# Patient Record
Sex: Male | Born: 1965 | Race: White | Hispanic: No | Marital: Single | Smoking: Never smoker
Health system: Southern US, Community
[De-identification: ages and names within clinical notes are randomized; demographics above are authoritative.]

## PROBLEM LIST (undated history)

## (undated) DIAGNOSIS — L509 Urticaria, unspecified: Secondary | ICD-10-CM

## (undated) DIAGNOSIS — B958 Unspecified staphylococcus as the cause of diseases classified elsewhere: Secondary | ICD-10-CM

## (undated) HISTORY — PX: KNEE SURGERY: SHX244

---

## 2004-07-20 ENCOUNTER — Emergency Department (HOSPITAL_COMMUNITY): Admission: EM | Admit: 2004-07-20 | Discharge: 2004-07-20 | Payer: Self-pay | Admitting: Emergency Medicine

## 2004-09-26 ENCOUNTER — Emergency Department (HOSPITAL_COMMUNITY): Admission: EM | Admit: 2004-09-26 | Discharge: 2004-09-26 | Payer: Self-pay | Admitting: Emergency Medicine

## 2005-11-24 ENCOUNTER — Emergency Department (HOSPITAL_COMMUNITY): Admission: EM | Admit: 2005-11-24 | Discharge: 2005-11-24 | Payer: Self-pay | Admitting: Emergency Medicine

## 2015-03-16 ENCOUNTER — Ambulatory Visit
Admission: EM | Admit: 2015-03-16 | Discharge: 2015-03-16 | Disposition: A | Discharge status: Home / Self Care | Payer: 59 | Attending: Family Medicine | Admitting: Family Medicine

## 2015-03-16 ENCOUNTER — Ambulatory Visit: Payer: BLUE CROSS/BLUE SHIELD

## 2015-03-16 DIAGNOSIS — S62634B Displaced fracture of distal phalanx of right ring finger, initial encounter for open fracture: Secondary | ICD-10-CM | POA: Insufficient documentation

## 2015-03-16 DIAGNOSIS — S62639B Displaced fracture of distal phalanx of unspecified finger, initial encounter for open fracture: Secondary | ICD-10-CM

## 2015-03-16 DIAGNOSIS — X58XXXA Exposure to other specified factors, initial encounter: Secondary | ICD-10-CM | POA: Insufficient documentation

## 2015-03-16 MED ORDER — SULFAMETHOXAZOLE-TRIMETHOPRIM 800-160 MG PO TABS: 1.0000 | ORAL_TABLET | Freq: Two times a day (BID) | ORAL | Status: DC

## 2015-03-16 NOTE — ED Notes (Signed)
Moving a piece of equipment (metal) on Friday afternoon and right 4th finger. Has laceration approx. 1 cm to finger tip and remains oozing. Was at work and instructed by boss to have finger checked out.

## 2015-03-16 NOTE — ED Provider Notes (Signed)
CSN: [id number]     Arrival date & time 03/16/15  1116 History   None    Chief Complaint  Patient presents with  . Laceration   (Consider location/radiation/quality/duration/timing/severity/associated sxs/prior Treatment) HPI   This 49 year old male who presents with a crush injury to his right dominant ring finger tuft. 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 He snatched his hand from the entrapment and had sustained a burst laceration to the very tip of his finger. He did not seek medical attention until today at the insistence of his neighbors. He has good sensation and good function of the tip.   History reviewed. No pertinent past medical history. Past Surgical History  Procedure Laterality Date  . Knee surgery Right    History reviewed. No pertinent family history. History  Substance Use Topics  . Smoking status: Never Smoker   . Smokeless tobacco: Not on file  . Alcohol Use: Yes     Comment: socially    Review of Systems  All other systems reviewed and are negative.   Allergies  Review of patient's allergies indicates no known allergies.  Home Medications   Prior to Admission medications   Medication Sig Start Date End Date Taking? Authorizing Provider  loratadine (CLARITIN) 10 MG tablet Take 10 mg by mouth daily.   Yes Historical Provider, MD  sulfamethoxazole-trimethoprim (BACTRIM DS,SEPTRA DS) 800-160 MG per tablet Take 1 tablet by mouth 2 (two) times daily. 03/16/15   Chrissie Noa Reuben Knoblock, PA-C   BP 152/101 mmHg  Pulse 70  Temp(Src) 97.7 F (36.5 C) (Tympanic)  Resp 18  Ht 6' (1.829 m)  Wt 222 lb (100.699 kg)  BMI 30.10 kg/m2  SpO2 98% Physical Exam  Constitutional: He is oriented to person, place, and time. He appears well-developed and well-nourished.  HENT:  Head: Normocephalic and atraumatic.  Eyes: Pupils are equal, round, and reactive to light.  Neck: Normal range  of motion. Neck supple.  Musculoskeletal: Normal range of motion.  Examination of the right hand shows a swollen distal fingertip of the right ring finger with a small 0.5 cm at the very tip. There is ecchymosis present. There is normal sensation to light touch. Flexion extension of the digit is normal. There is no subungual hematoma present.  Neurological: He is alert and oriented to person, place, and time. He has normal reflexes.  Skin: Skin is warm and dry.  Psychiatric: He has a normal mood and affect. His behavior is normal. Judgment and thought content normal.    ED Course  Procedures (including critical care time) Labs Review Labs Reviewed - No data to display  Imaging Review Dg Finger Ring Right  03/16/2015   CLINICAL DATA:  Crush injury to the right long finger, injured while moving a piece of metal equipment while at work 2 days ago. Laceration with persistent bruising. Initial encounter.  EXAM: RIGHT RING FINGER 2+V  COMPARISON:  None.  FINDINGS: Mildly displaced fracture involving the medial aspect of the distal tuft of the distal phalanx. No other fractures. Overlying soft tissue swelling and soft tissue injury related to the laceration. Well preserved joint spaces. Well preserved bone mineral density.  IMPRESSION: Acute traumatic mildly displaced fracture involving the medial aspect of the distal tuft of the distal phalanx.   Electronically Signed   By: Hulan Saas M.D.   On: 03/16/2015 12:35     MDM   1.  Open fracture of tuft of distal phalanx of finger, initial encounter    New Prescriptions   SULFAMETHOXAZOLE-TRIMETHOPRIM (BACTRIM DS,SEPTRA DS) 800-160 MG PER TABLET    Take 1 tablet by mouth 2 (two) times daily.    Plan: 1. Test/x-ray results and diagnosis reviewed with patient 2. rx as per orders; risks, benefits, potential side effects reviewed with patient 3. Recommend supportive treatment with  4. F/u prn if symptoms worsen or don't improve  I discussed with  the patient the findings of the open tuft fracture. He's had delay in being seen and therefore we will not close the burst laceration. Instead we will have him on a washing program at home. Signs and symptoms of infection were reviewed with the patient. Initially he did not want to take antibiotics but eventually agreed and we will place him on Septra DS 1 twice a day for 5 days. If he has any question or any of the early signs of infection he will go medially to the emergency department return here. He does travel for a living and [place] in [place] boy back in the [place] area towards in the week and I have encouraged him to return here for follow-up. He understands the increased risks of infection due to the delay in care.   Lutricia FeilWilliam P Zuly Belkin, PA-C 03/16/15 1302

## 2015-09-08 ENCOUNTER — Ambulatory Visit
Admission: EM | Admit: 2015-09-08 | Discharge: 2015-09-08 | Disposition: A | Discharge status: Home / Self Care | Payer: 59 | Attending: Family Medicine | Admitting: Family Medicine

## 2015-09-08 ENCOUNTER — Ambulatory Visit (INDEPENDENT_AMBULATORY_CARE_PROVIDER_SITE_OTHER): Payer: 59

## 2015-09-08 DIAGNOSIS — L03011 Cellulitis of right finger: Secondary | ICD-10-CM | POA: Diagnosis not present

## 2015-09-08 HISTORY — DX: Urticaria, unspecified: L50.9

## 2015-09-08 MED ORDER — CEFAZOLIN SODIUM 1 G IJ SOLR
1.0000 g | Freq: Once | INTRAMUSCULAR | Status: AC
  Administered 2015-09-08: 1 g via INTRAMUSCULAR

## 2015-09-08 MED ORDER — SULFAMETHOXAZOLE-TRIMETHOPRIM 800-160 MG PO TABS: 1.0000 | ORAL_TABLET | Freq: Two times a day (BID) | ORAL | Status: DC

## 2015-09-08 MED ORDER — HYDROCODONE-ACETAMINOPHEN 5-325 MG PO TABS: 1.0000 | ORAL_TABLET | Freq: Four times a day (QID) | ORAL | Status: AC | PRN

## 2015-09-08 NOTE — Discharge Instructions (Signed)
Cellulitis °Cellulitis is an infection of the skin and the tissue beneath it. The infected area is usually red and tender. Cellulitis occurs most often in the arms and lower legs.  °CAUSES  °Cellulitis is caused by bacteria that enter the skin through cracks or cuts in the skin. The most common types of bacteria that cause cellulitis are staphylococci and streptococci. °SIGNS AND SYMPTOMS  °· Redness and warmth. °· Swelling. °· Tenderness or pain. °· Fever. °DIAGNOSIS  °Your health care provider can usually determine what is wrong based on a physical exam. Blood tests may also be done. °TREATMENT  °Treatment usually involves taking an antibiotic medicine. °HOME CARE INSTRUCTIONS  °· Take your antibiotic medicine as directed by your health care provider. Finish the antibiotic even if you start to feel better. °· Keep the infected arm or leg elevated to reduce swelling. °· Apply a warm cloth to the affected area up to 4 times per day to relieve pain. °· Take medicines only as directed by your health care provider. °· Keep all follow-up visits as directed by your health care provider. °SEEK MEDICAL CARE IF:  °· You notice red streaks coming from the infected area. °· Your red area gets larger or turns dark in color. °· Your bone or joint underneath the infected area becomes painful after the skin has healed. °· Your infection returns in the same area or another area. °· You notice a swollen bump in the infected area. °· You develop new symptoms. °· You have a fever. °SEEK IMMEDIATE MEDICAL CARE IF:  °· You feel very sleepy. °· You develop vomiting or diarrhea. °· You have a general ill feeling (malaise) with muscle aches and pains. °  °This information is not intended to replace advice given to you by your health care provider. Make sure you discuss any questions you have with your health care provider. °  °Document Released: 07/21/2005 Document Revised: 07/02/2015 Document Reviewed: 12/27/2011 °Elsevier Interactive  Patient Education ©2016 [employer] ° °Fingertip Infection °When an infection is around the nail, it is called a paronychia. When it appears over the tip of the finger, it is called a felon. These infections are due to minor injuries or cracks in the skin. If they are not treated properly, they can lead to bone infection and permanent damage to the fingernail. °Incision and drainage is necessary if a pus pocket (an abscess) has formed. Antibiotics and pain medicine may also be needed. Keep your hand elevated for the next 2-3 days to reduce swelling and pain. If a pack was placed in the abscess, it should be removed in 1-2 days by your caregiver. Soak the finger in warm water for 20 minutes 4 times daily to help promote drainage. °Keep the hands as dry as possible. Wear protective gloves with cotton liners. See your caregiver for follow-up care as recommended.  °HOME CARE INSTRUCTIONS  °· Keep wound clean, dry and dressed as suggested by your caregiver. °· Soak in warm salt water for fifteen minutes, four times per day for bacterial infections. °· Your caregiver will prescribe an antibiotic if a bacterial infection is suspected. Take antibiotics as directed and finish the prescription, even if the problem appears to be improving before the medicine is gone. °· Only take over-the-counter or prescription medicines for pain, discomfort, or fever as directed by your caregiver. °SEEK IMMEDIATE MEDICAL CARE IF: °· There is redness, swelling, or increasing pain in the wound. °· Pus or any other unusual drainage is coming   from the wound. °· An unexplained oral temperature above 102° F (38.9° C) develops. °· You notice a foul smell coming from the wound or dressing. °MAKE SURE YOU:  °· Understand these instructions. °· Monitor your condition. °· Contact your caregiver if you are getting worse or not improving. °  °This information is not intended to replace advice given to you by your health care provider. Make sure you  discuss any questions you have with your health care provider. °  °Document Released: 11/18/2004 Document Revised: 01/03/2012 Document Reviewed: 03/31/2015 °Elsevier Interactive Patient Education ©2016 [employer] ° °

## 2015-09-08 NOTE — ED Provider Notes (Signed)
CSN: [id number]     Arrival date & time 09/08/15  [id number] History   First MD Initiated Contact with Patient 09/08/15 [phone]     Chief Complaint  Patient presents with  . Cellulitis   (Consider location/radiation/quality/duration/timing/severity/associated sxs/prior Treatment) HPI Comments: 49 yo male with a 5 days h/o right ring finger redness, swelling and pain. Denies any injuries, trauma, fall, fevers, chills. Has noticed a slight amount of yellow drainage.  The history is provided by the patient.    Past Medical History  Diagnosis Date  . Urticaria    Past Surgical History  Procedure Laterality Date  . Knee surgery Right    Family History  Problem Relation Age of Onset  . Heart failure Father    Social History  Substance Use Topics  . Smoking status: Never Smoker   . Smokeless tobacco: None  . Alcohol Use: Yes     Comment: socially    Review of Systems  Allergies  Review of patient's allergies indicates no known allergies.  Home Medications   Prior to Admission medications   Medication Sig Start Date End Date Taking? Authorizing Provider  loratadine (CLARITIN) 10 MG tablet Take 10 mg by mouth daily.   Yes Historical Provider, MD  HYDROcodone-acetaminophen (NORCO/VICODIN) 5-325 MG tablet Take 1-2 tablets by mouth every 6 (six) hours as needed. 09/08/15   Payton Mccallumrlando Debi Cousin, MD  sulfamethoxazole-trimethoprim (BACTRIM DS,SEPTRA DS) 800-160 MG tablet Take 1 tablet by mouth 2 (two) times daily. 09/08/15   Payton Mccallumrlando Kamrin Spath, MD   Meds Ordered and Administered this Visit   Medications  ceFAZolin (ANCEF) injection 1 g (1 g Intramuscular Given 09/08/15 0940)    BP 146/104 mmHg  Pulse 90  Temp(Src) 98.1 F (36.7 C) (Tympanic)  Resp 17  Ht 6' (1.829 m)  Wt 225 lb (102.059 kg)  BMI 30.51 kg/m2  SpO2 98% No data found.   Physical Exam  Constitutional: He appears well-developed and well-nourished. No distress.  Musculoskeletal: He exhibits edema and tenderness.       Right  hand: He exhibits decreased range of motion, tenderness and swelling. He exhibits normal capillary refill and no laceration. Normal sensation noted. Normal strength noted.       Hands: Right hand neurovascularly intact; right ring finger with edema, tenderness to palpation, warmth and blanchable erythema  Skin: He is not diaphoretic.  Nursing note and vitals reviewed.   ED Course  Procedures (including critical care time)  Labs Review Labs Reviewed - No data to display  Imaging Review No results found.   Visual Acuity Review  Right Eye Distance:   Left Eye Distance:   Bilateral Distance:    Right Eye Near:   Left Eye Near:    Bilateral Near:         MDM   1. Cellulitis of finger of right hand    Discharge Medication List as of 09/08/2015 10:51 AM    START taking these medications   Details  HYDROcodone-acetaminophen (NORCO/VICODIN) 5-325 MG tablet Take 1-2 tablets by mouth every 6 (six) hours as needed., Starting 09/08/2015, Until Discontinued, Print      1. Labs/x-ray results and diagnosis reviewed with patient 2. rx as per orders above; reviewed possible side effects, interactions, risks and benefits; rx for Bactrim DS 1 po bid x10 days 3. Patient given 1gm Ancef IM x 1 4. Recommend supportive treatment with rest, elevation, warm compresses to area 5. Explained to patient if symptoms worsen or are not improving in next 48 hours,  recommend going to ED for possible  IV antibiotics  Payton Mccallum, MD 09/10/15 1718

## 2015-09-08 NOTE — ED Notes (Addendum)
Thursday past felt "a hot poker feeling" right rth finger. Now very red with + swelling and blister and yellow drainage

## 2015-09-09 ENCOUNTER — Telehealth: Payer: Self-pay | Admitting: Emergency Medicine

## 2015-09-10 ENCOUNTER — Telehealth: Payer: Self-pay | Admitting: Family Medicine

## 2015-09-10 NOTE — ED Notes (Signed)
Patient called clinic and states after 3 days of oral antibiotic the finger infection does not appear to be improving and now is having pain around the wrist and elbow. Discussed with patient, I recommend that he go to ED for further evaluation/management (possible IV antibiotics, possible hand orthopedist consultation, possible admission)  Timothy Mccallumrlando Kensington Rios, MD 09/10/15 1721

## 2015-10-29 ENCOUNTER — Ambulatory Visit
Admission: EM | Admit: 2015-10-29 | Discharge: 2015-10-29 | Disposition: A | Discharge status: Home / Self Care | Payer: Managed Care, Other (non HMO) | Attending: Family Medicine | Admitting: Family Medicine

## 2015-10-29 ENCOUNTER — Encounter: Payer: Self-pay | Admitting: Gynecology

## 2015-10-29 DIAGNOSIS — L03031 Cellulitis of right toe: Secondary | ICD-10-CM

## 2015-10-29 HISTORY — DX: Unspecified staphylococcus as the cause of diseases classified elsewhere: B95.8

## 2015-10-29 MED ORDER — CEPHALEXIN 500 MG PO CAPS: 500.0000 mg | ORAL_CAPSULE | Freq: Three times a day (TID) | ORAL | Status: DC

## 2015-10-29 NOTE — ED Provider Notes (Signed)
CSN: [id number]     Arrival date & time 10/29/15  1841 History   First MD Initiated Contact with Patient 10/29/15 2024     Chief Complaint  Patient presents with  . infected toe    (Consider location/radiation/quality/duration/timing/severity/associated sxs/prior Treatment) HPI Comments: 50 yo male with a 2 days h/o right big toe skin lesion and redness. Patient states he thinks maybe something bit him on the toe. Denies any fevers, chills, numbness/tingling, drainage.   The history is provided by the patient.    Past Medical History  Diagnosis Date  . Urticaria   . Staph infection    Past Surgical History  Procedure Laterality Date  . Knee surgery Right    Family History  Problem Relation Age of Onset  . Heart failure Father    Social History  Substance Use Topics  . Smoking status: Never Smoker   . Smokeless tobacco: None  . Alcohol Use: Yes     Comment: socially    Review of Systems  Allergies  Review of patient's allergies indicates no known allergies.  Home Medications   Prior to Admission medications   Medication Sig Start Date End Date Taking? Authorizing Provider  loratadine (CLARITIN) 10 MG tablet Take 10 mg by mouth daily.   Yes Historical Provider, MD  cephALEXin (KEFLEX) 500 MG capsule Take 1 capsule (500 mg total) by mouth 3 (three) times daily. 10/29/15   Payton Mccallum, MD  HYDROcodone-acetaminophen (NORCO/VICODIN) 5-325 MG tablet Take 1-2 tablets by mouth every 6 (six) hours as needed. 09/08/15   Payton Mccallum, MD  sulfamethoxazole-trimethoprim (BACTRIM DS,SEPTRA DS) 800-160 MG tablet Take 1 tablet by mouth 2 (two) times daily. 09/08/15   Payton Mccallum, MD   Meds Ordered and Administered this Visit  Medications - No data to display  BP 140/96 mmHg  Pulse 78  Temp(Src) 98.7 F (37.1 C) (Oral)  Resp 18  Ht 6' (1.829 m)  Wt 216 lb (97.977 kg)  BMI 29.29 kg/m2  SpO2 99% No data found.   Physical Exam  Constitutional: He appears well-developed  and well-nourished. No distress.  Skin: He is not diaphoretic. There is erythema.  Right big toe dorsal skin with 4mm raised pustule and surrounding skin blanchable erythema, warmth and mild tenderness to palpation; no edema; foot neurovascularly intact  Nursing note and vitals reviewed.   ED Course  Procedures (including critical care time)  Labs Review Labs Reviewed  CULTURE, ROUTINE-ABSCESS    Imaging Review No results found.   Visual Acuity Review  Right Eye Distance:   Left Eye Distance:   Bilateral Distance:    Right Eye Near:   Left Eye Near:    Bilateral Near:         MDM   1. Cellulitis of toe of right foot    Discharge Medication List as of 10/29/2015  8:56 PM    START taking these medications   Details  cephALEXin (KEFLEX) 500 MG capsule Take 1 capsule (500 mg total) by mouth 3 (three) times daily., Starting 10/29/2015, Until Discontinued, Print       1. diagnosis reviewed with patient; right big toe skin cleaned with alcohol, pustule nicked/opened with an #11 scalpel with spontaneous drainage of purulent material; sample obtained for culture 2. rx as per orders above; reviewed possible side effects, interactions, risks and benefits  3. Recommend supportive treatment with warm compresses or soaks 4. Await wound/abscess culture results 5. Follow-up prn if symptoms worsen or don't improve    [address], [place]  Judd Gaudieronty, MD 10/29/15 2123

## 2015-10-29 NOTE — ED Notes (Signed)
Patient c/o lump on right big toe / infected / redness. Patient stated s/p staph infection on finger when last seen on 09/08/15.

## 2015-11-02 LAB — CULTURE, ROUTINE-ABSCESS: SPECIAL REQUESTS: NORMAL

## 2015-11-03 ENCOUNTER — Telehealth: Payer: Self-pay

## 2015-11-03 NOTE — ED Notes (Signed)
Patient contacted to inform that was + staph on wound specimen. States wound is improving. Instructed to finish course of antibiotics.

## 2017-06-23 ENCOUNTER — Ambulatory Visit (INDEPENDENT_AMBULATORY_CARE_PROVIDER_SITE_OTHER): Payer: Managed Care, Other (non HMO)

## 2017-06-23 ENCOUNTER — Ambulatory Visit
Admission: EM | Admit: 2017-06-23 | Discharge: 2017-06-23 | Disposition: A | Discharge status: Home / Self Care | Payer: Managed Care, Other (non HMO) | Attending: Family Medicine | Admitting: Family Medicine

## 2017-06-23 ENCOUNTER — Encounter: Payer: Self-pay | Admitting: Emergency Medicine

## 2017-06-23 DIAGNOSIS — J189 Pneumonia, unspecified organism: Secondary | ICD-10-CM

## 2017-06-23 DIAGNOSIS — R509 Fever, unspecified: Secondary | ICD-10-CM | POA: Diagnosis not present

## 2017-06-23 DIAGNOSIS — M791 Myalgia: Secondary | ICD-10-CM

## 2017-06-23 LAB — CBC WITH DIFFERENTIAL/PLATELET
Basophils Absolute: 0.1 10*3/uL (ref 0–0.1)
Basophils Relative: 1 %
EOS PCT: 1 %
Eosinophils Absolute: 0 10*3/uL (ref 0–0.7)
HCT: 47.2 % (ref 40.0–52.0)
Hemoglobin: 16.4 g/dL (ref 13.0–18.0)
LYMPHS ABS: 1 10*3/uL (ref 1.0–3.6)
Lymphocytes Relative: 11 %
MCH: 29.5 pg (ref 26.0–34.0)
MCHC: 34.7 g/dL (ref 32.0–36.0)
MCV: 85.1 fL (ref 80.0–100.0)
MONOS PCT: 10 %
Monocytes Absolute: 0.9 10*3/uL (ref 0.2–1.0)
Neutro Abs: 6.8 10*3/uL — ABNORMAL HIGH (ref 1.4–6.5)
Neutrophils Relative %: 77 %
Platelets: 150 10*3/uL (ref 150–440)
RBC: 5.54 MIL/uL (ref 4.40–5.90)
RDW: 13.2 % (ref 11.5–14.5)
WBC: 8.8 10*3/uL (ref 3.8–10.6)

## 2017-06-23 LAB — URINALYSIS, COMPLETE (UACMP) WITH MICROSCOPIC
Bacteria, UA: NONE SEEN
GLUCOSE, UA: NEGATIVE mg/dL
Hgb urine dipstick: NEGATIVE
Ketones, ur: NEGATIVE mg/dL
Leukocytes, UA: NEGATIVE
Nitrite: NEGATIVE
PH: 7 (ref 5.0–8.0)
RBC / HPF: NONE SEEN RBC/hpf (ref 0–5)
Specific Gravity, Urine: 1.02 (ref 1.005–1.030)
Squamous Epithelial / LPF: NONE SEEN

## 2017-06-23 LAB — RAPID STREP SCREEN (MED CTR MEBANE ONLY): Streptococcus, Group A Screen (Direct): NEGATIVE

## 2017-06-23 LAB — RAPID INFLUENZA A&B ANTIGENS
Influenza A (ARMC): NEGATIVE
Influenza B (ARMC): NEGATIVE

## 2017-06-23 MED ORDER — LEVOFLOXACIN 750 MG PO TABS: 750.0000 mg | ORAL_TABLET | Freq: Every day | ORAL | 0 refills | Status: AC

## 2017-06-23 MED ORDER — BENZONATATE 200 MG PO CAPS: 200.0000 mg | ORAL_CAPSULE | Freq: Three times a day (TID) | ORAL | 0 refills | Status: AC | PRN

## 2017-06-23 MED ORDER — ACETAMINOPHEN 500 MG PO TABS
1000.0000 mg | ORAL_TABLET | Freq: Once | ORAL | Status: AC
  Administered 2017-06-23: 1000 mg via ORAL

## 2017-06-23 NOTE — ED Provider Notes (Signed)
[facility]    CSN: [id number] Arrival date & time: 06/23/17  1735     History   Chief Complaint Chief Complaint  Patient presents with  . Fever  . Generalized Body Aches    HPI Timothy Wilson is a 51 y.o. male.   Patient is a 51 year old white male with a fever for 2 days now. He denies any problem except for pressure behind his eyes headache in general malaise. Came in to get a flu test. He states that he stop coughing no burning with urination no sore throat he denies any tick bites or tick removals recently. He states that he'll find occasion tick running on him when he plays golf but nothing embedded. He states his son was sick last week but he is doing better. No known drug allergies he does not smoke states his father has arrhythmia and history heart failure but no significant heart disease states his some cancers in his father's side of the family but nothing specific they think is relevant to today's visit. He is just tired and wants to go home. States his temperature went to 103C [name] he probably needs to be seen today since his been having this fever now for 2 days no nausea no vomiting. He has several orthopedic surgeries. No known drug allergies.   The history is provided by the patient. No language interpreter was used.  Fever  Temp source:  Oral Severity:  Moderate Onset quality:  Gradual Duration:  2 days Timing:  Constant Progression:  Worsening Chronicity:  New Relieved by:  Nothing Worsened by:  Nothing Associated symptoms: chills, headaches and myalgias   Associated symptoms: no chest pain, no congestion, no cough, no dysuria, no rhinorrhea, no somnolence and no sore throat     Past Medical History:  Diagnosis Date  . Staph infection   . Urticaria     There are no active problems to display for this patient.   Past Surgical History:  Procedure Laterality Date  . KNEE SURGERY Right        Home Medications    Prior to Admission  medications   Medication Sig Start Date End Date Taking? Authorizing Provider  benzonatate (TESSALON) 200 MG capsule Take 1 capsule (200 mg total) by mouth 3 (three) times daily as needed. 06/23/17   Hassan Rowan, MD  HYDROcodone-acetaminophen (NORCO/VICODIN) 5-325 MG tablet Take 1-2 tablets by mouth every 6 (six) hours as needed. 09/08/15   Payton Mccallum, MD  levofloxacin (LEVAQUIN) 750 MG tablet Take 1 tablet (750 mg total) by mouth daily. 06/23/17   Hassan Rowan, MD  loratadine (CLARITIN) 10 MG tablet Take 10 mg by mouth daily.    [provider]    Family History Family History  Problem Relation Age of Onset  . Heart failure Father     Social History Social History  Substance Use Topics  . Smoking status: Never Smoker  . Smokeless tobacco: Never Used  . Alcohol use Yes     Comment: socially     Allergies   Patient has no known allergies.   Review of Systems Review of Systems  Constitutional: Positive for chills and fever.  HENT: Negative for congestion, rhinorrhea and sore throat.   Respiratory: Negative for cough.   Cardiovascular: Negative for chest pain.  Genitourinary: Negative for dysuria.  Musculoskeletal: Positive for myalgias.  Neurological: Positive for headaches.  All other systems reviewed and are negative.    Physical Exam Triage Vital Signs ED Triage Vitals  Enc Vitals Group     BP 06/23/17 1750 (!) 136/96     Pulse Rate 06/23/17 1750 (!) 120     Resp 06/23/17 1750 16     Temp 06/23/17 1750 (!) 103.2 F (39.6 C)     Temp Source 06/23/17 1750 Oral     SpO2 06/23/17 1750 96 %     Weight 06/23/17 1747 220 lb (99.8 kg)     Height 06/23/17 1747 6' (1.829 m)     Head Circumference --      Peak Flow --      Pain Score 06/23/17 1747 8     Pain Loc --      Pain Edu? --      Excl. in GC? --    No data found.   Updated Vital Signs BP (!) 136/96 (BP Location: Left Arm)   Pulse (!) 107   Temp (!) 100.7 F (38.2 C) (Oral)   Resp 16   Ht  6' (1.829 m)   Wt 220 lb (99.8 kg)   SpO2 96%   BMI 29.84 kg/m   Visual Acuity Right Eye Distance:   Left Eye Distance:   Bilateral Distance:    Right Eye Near:   Left Eye Near:    Bilateral Near:     Physical Exam  Constitutional: He appears well-developed and well-nourished.  HENT:  Head: Normocephalic and atraumatic.  Right Ear: External ear normal.  Left Ear: External ear normal.  Mouth/Throat: Oropharynx is clear and moist.  Eyes: Pupils are equal, round, and reactive to light.  Neck: Normal range of motion. Neck supple.  Cardiovascular: Normal rate and regular rhythm.   Pulmonary/Chest: Effort normal.  Musculoskeletal: Normal range of motion.  Neurological: He is alert.  Skin: Skin is warm.  Psychiatric: He has a normal mood and affect.  Vitals reviewed.    UC Treatments / Results  Labs (all labs ordered are listed, but only abnormal results are displayed) Labs Reviewed  CBC WITH DIFFERENTIAL/PLATELET - Abnormal; Notable for the following:       Result Value   Neutro Abs 6.8 (*)    All other components within normal limits  URINALYSIS, COMPLETE (UACMP) WITH MICROSCOPIC - Abnormal; Notable for the following:    Bilirubin Urine SMALL (*)    Protein, ur TRACE (*)    All other components within normal limits  RAPID INFLUENZA A&B ANTIGENS ([facility] ONLY)  RAPID STREP SCREEN (NOT AT [facility])  URINE CULTURE  CULTURE, GROUP A STREP ([facility])  ROCKY MTN SPOTTED FVR ABS PNL(IGG+IGM)  B. BURGDORFI ANTIBODIES    EKG  EKG Interpretation None       Radiology Dg Chest 2 View  Result Date: 06/23/2017 CLINICAL DATA:  Fever and body EXAM: CHEST  2 VIEW COMPARISON:  None. FINDINGS: Mild infiltrate at the lingula. No pleural effusion. Normal heart size. No pneumothorax. IMPRESSION: Lingular pneumonia. Radiographic follow-up to resolution is recommended Electronically Signed   By: Jasmine PangKim  Fujinaga M.D.   On: 06/23/2017 19:43    Procedures Procedures (including critical care  time)  Medications Ordered in UC Medications  acetaminophen (TYLENOL) tablet 1,000 mg (1,000 mg Oral Given 06/23/17 1756)     Initial Impression / Assessment and Plan / UC Course  I have reviewed the triage vital signs and the nursing notes.  Pertinent labs & imaging results that were available during my care of the patient were reviewed by me and considered in my medical decision making (see chart for details).  Patient before he left remembers now tick was on him about a month ago he states it wasn't embedded and he also reports having some rashes on his arms placed over the nursing assistant that he thought was hives. While these things made him more risk for RMSF turns out that his chest x-ray shows a lingula pneumonia. Of going back into talk to him about this will place him on Levaquin 750 one tablet day for 7 days warned him about the increase risk for tendon rupture. Follow-up with PCP in 2-3 weeks for repeat chest x-ray.   Final Clinical Impressions(s) / UC Diagnoses   Final diagnoses:  Lingular pneumonia    New Prescriptions Discharge Medication List as of 06/23/2017  8:10 PM    START taking these medications   Details  benzonatate (TESSALON) 200 MG capsule Take 1 capsule (200 mg total) by mouth 3 (three) times daily as needed., Starting Thu 06/23/2017, Normal    levofloxacin (LEVAQUIN) 750 MG tablet Take 1 tablet (750 mg total) by mouth daily., Starting Thu 06/23/2017, Normal       Note: This dictation was prepared with Dragon dictation along with smaller phrase technology. Any transcriptional errors that result from this process are unintentional.  Controlled Substance Prescriptions [place] Controlled Substance Registry consulted? Not Applicable   Hassan Rowan, MD 06/23/17 2026

## 2017-06-23 NOTE — ED Triage Notes (Signed)
Patient c/o fever and bodyaches that started 2 days ago.

## 2017-06-23 NOTE — Discharge Instructions (Signed)
Warned not to do any heavy exercise for at least 2 weeks after taking the Levaquin because of concern of  risk of rupturing tendons

## 2017-06-25 LAB — URINE CULTURE
Culture: NO GROWTH
SPECIAL REQUESTS: NORMAL

## 2017-06-26 LAB — CULTURE, GROUP A STREP (THRC)

## 2017-06-27 ENCOUNTER — Telehealth: Payer: Self-pay

## 2017-06-27 ENCOUNTER — Telehealth: Payer: Self-pay | Admitting: *Deleted

## 2017-06-27 NOTE — Telephone Encounter (Signed)
Patient was seen on 06/23/17 and prescribed Levaquin for lingular pneumonia. Patient called today and reported not taking the Levaquin due to reading on the Internet how dangerous it is. Patient would like an alternative antibiotic treatment for his pneumonia. Patient also stated that he has been leaving messages without a response since last week. No record of any messages were found in the chart.

## 2017-06-27 NOTE — Telephone Encounter (Signed)
Messages were checked at the end of the day and a message was on the machine from 5:30p.m.  stating he spoke to [place] earlier in the day and was waiting for a call back. We spoke to Dr. Judd Gaudieronty and he was willing to call in a new prescription. I called to inform pt and he states he already went to another urgent care in the meantime, stating they took care of his problem. I apologized for the delay in getting back to him today, and he hung up abruptly.

## 2017-06-28 LAB — B. BURGDORFI ANTIBODIES: B burgdorferi Ab IgG+IgM: <0.91 {ISR} (ref 0.00–0.90)

## 2017-06-29 LAB — ROCKY MTN SPOTTED FVR ABS PNL(IGG+IGM)
RMSF IGG: POSITIVE — AB
RMSF IgM: 0.36 index (ref 0.00–0.89)

## 2017-06-29 LAB — RMSF, IGG, IFA

## 2017-07-01 ENCOUNTER — Encounter: Payer: Self-pay | Admitting: Emergency Medicine

## 2018-01-02 IMAGING — CR DG CHEST 2V
3 series · 3 of 3 positions shown · non-contrast
Comparison: None.

CLINICAL DATA: Fever and body

EXAM:
CHEST  2 VIEW

[chest pa (1 of 2)]
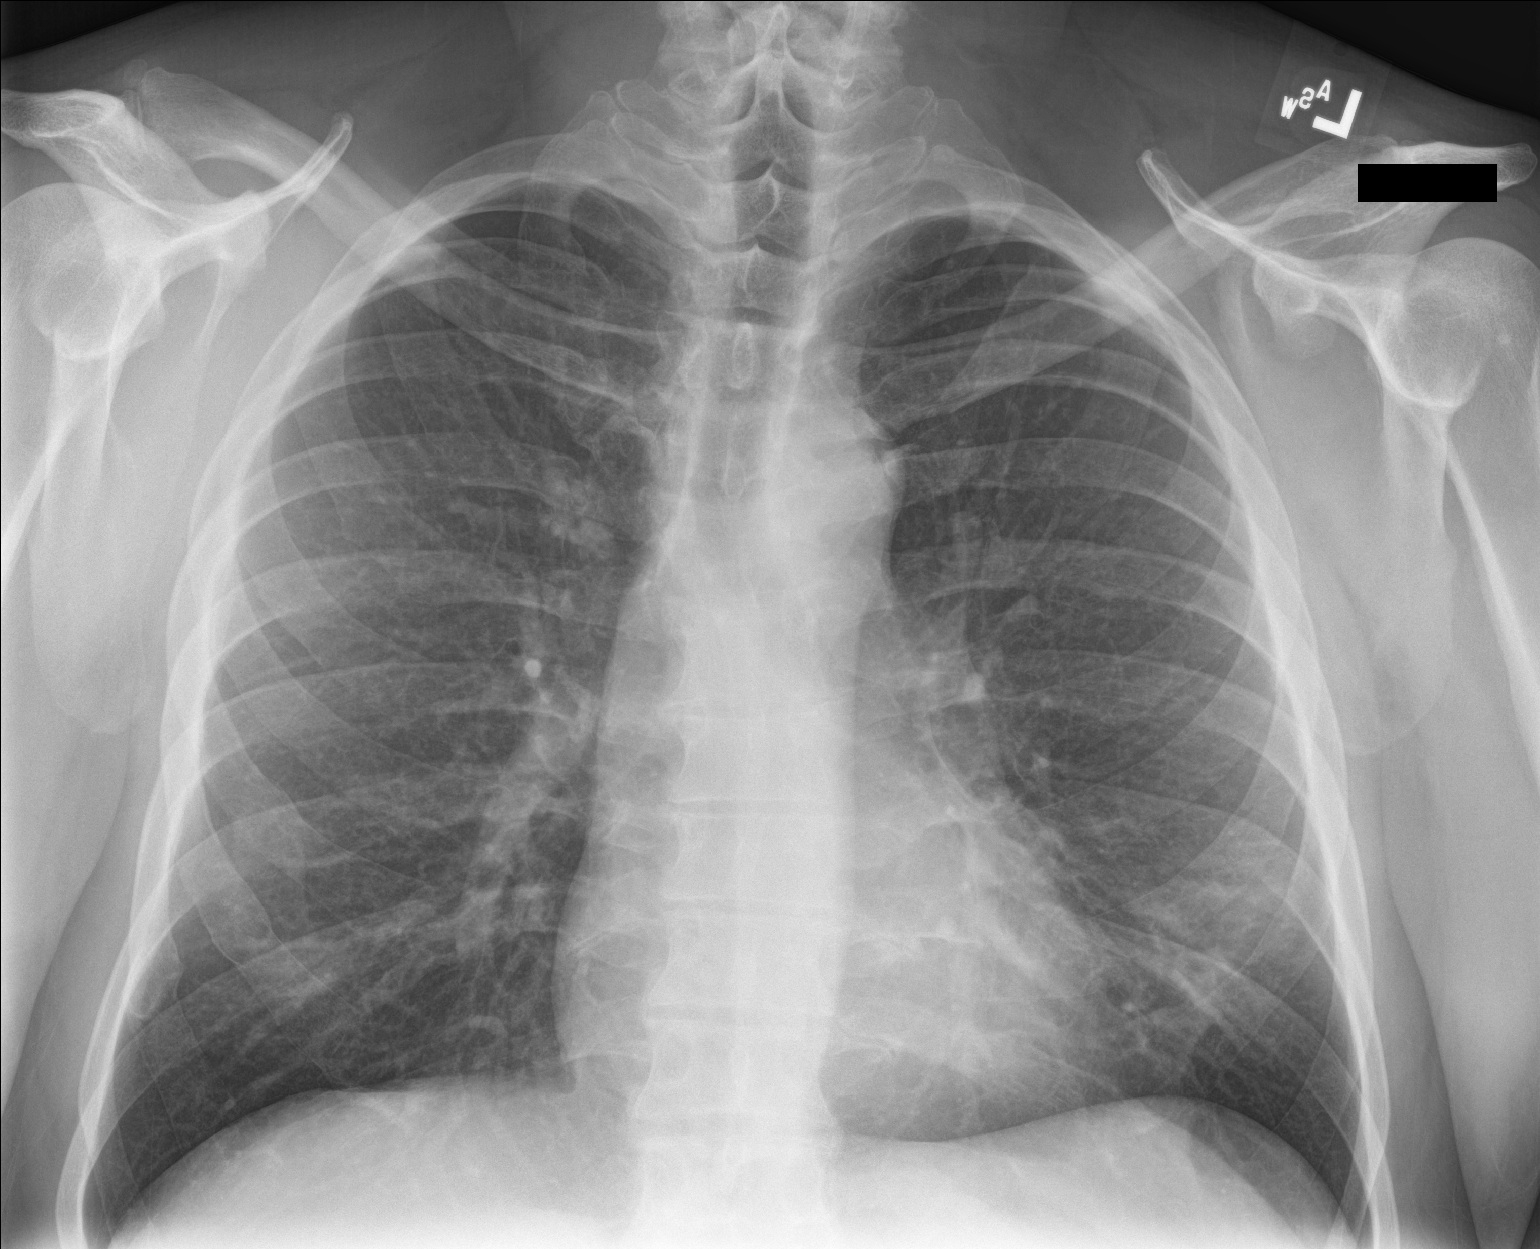

[chest lat]
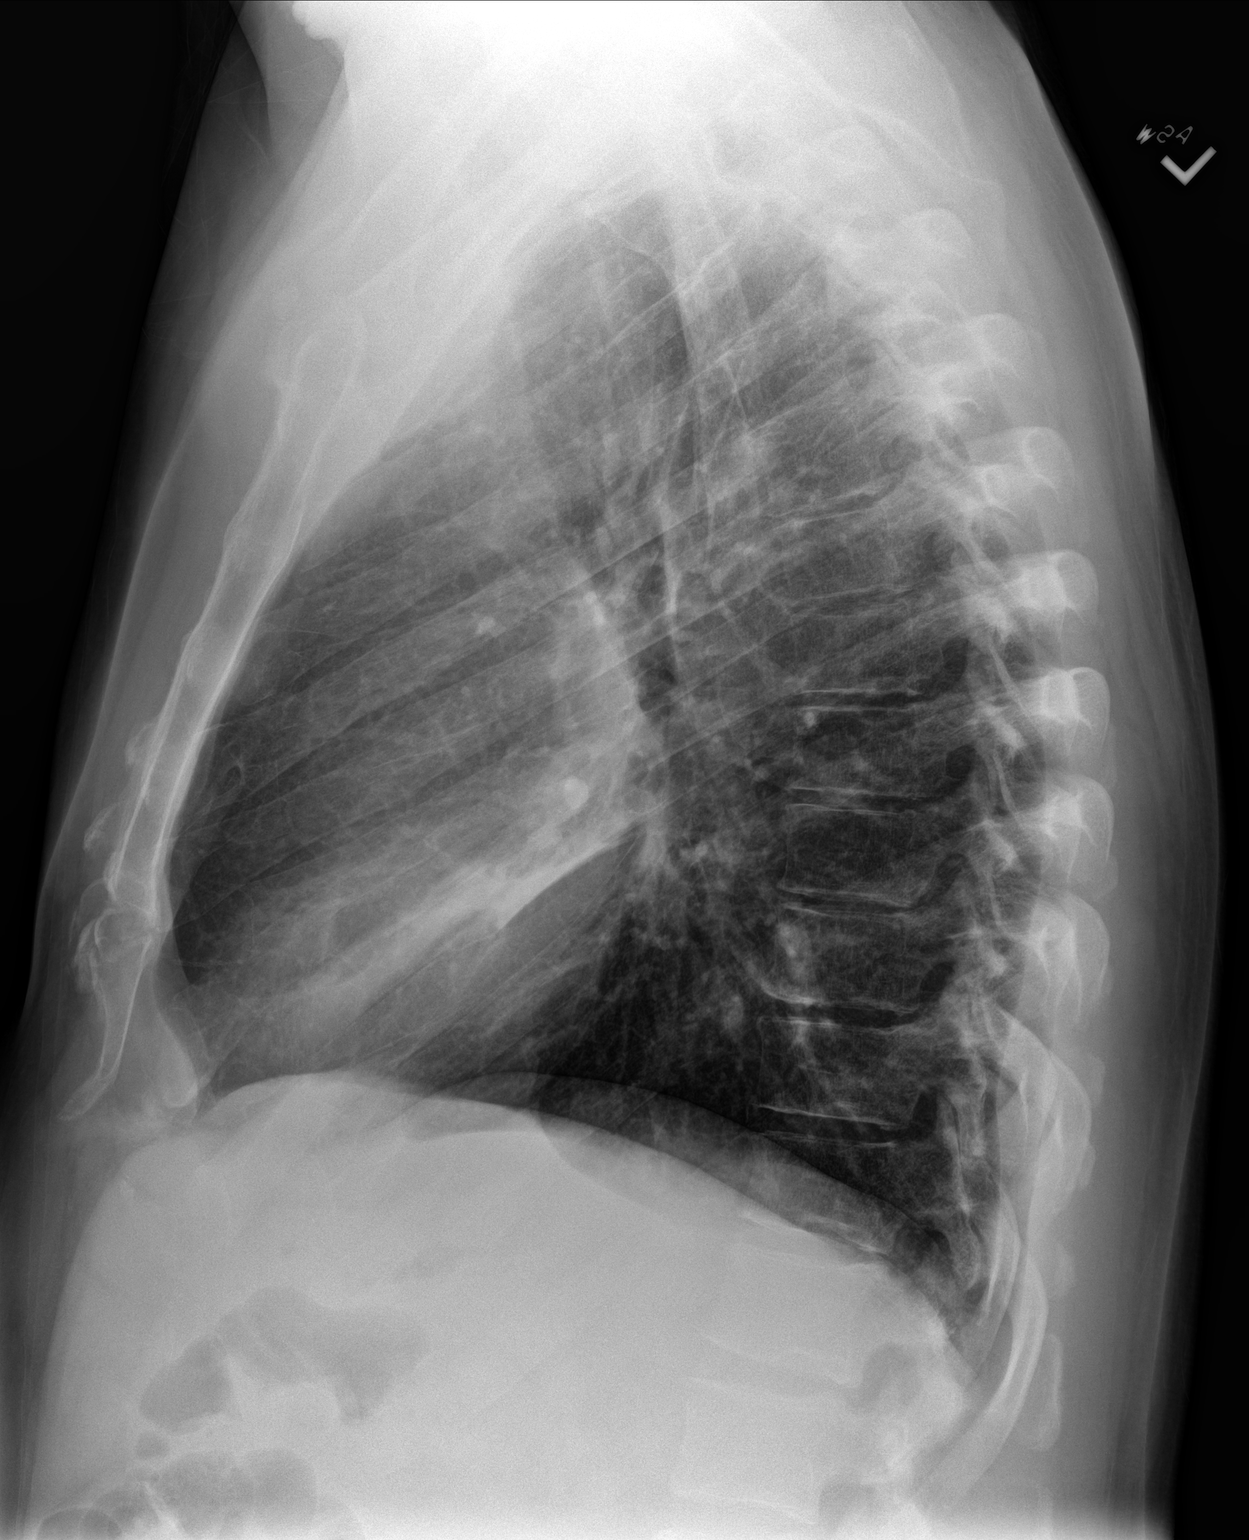

[chest pa (2 of 2)]
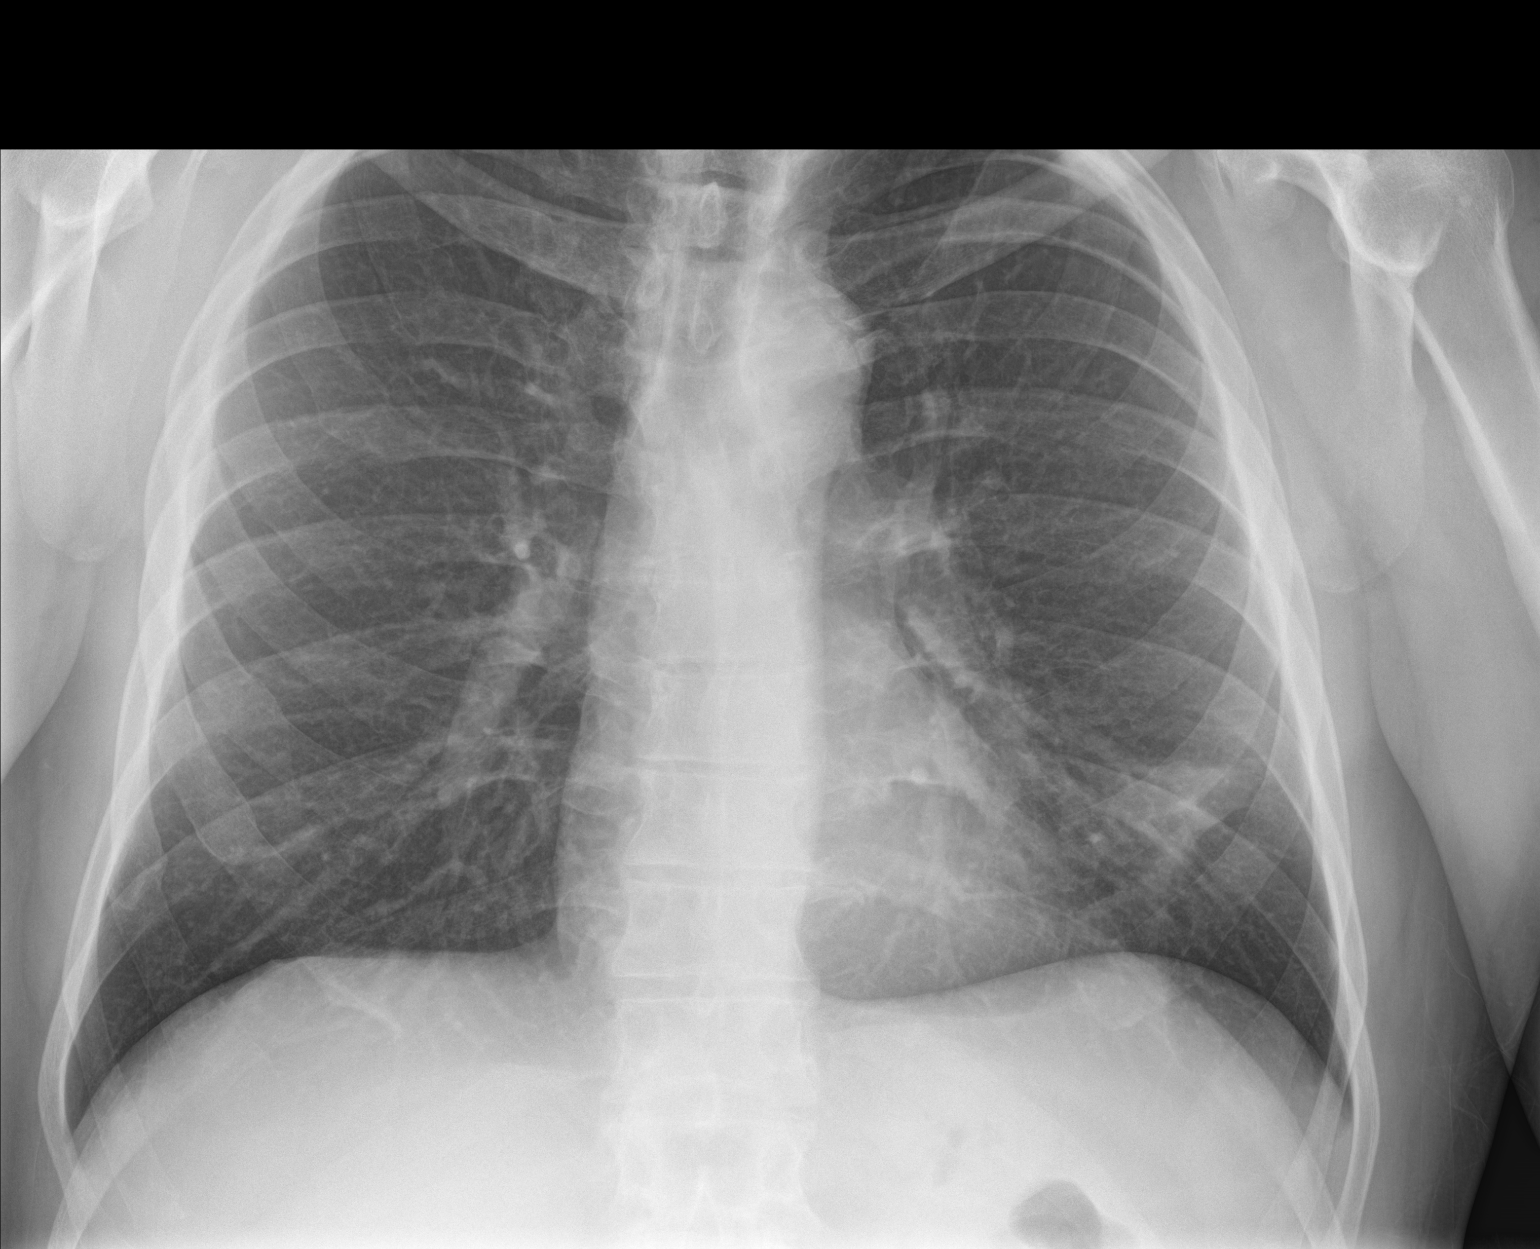

[3 of 3 positions shown; findings below may reference images not displayed]

FINDINGS: Mild infiltrate at the lingula. No pleural effusion. Normal heart
size. No pneumothorax.
IMPRESSION: Lingular pneumonia. Radiographic follow-up to resolution is
recommended

## 2019-05-03 ENCOUNTER — Other Ambulatory Visit: Payer: Self-pay | Admitting: Family Medicine

## 2019-05-03 DIAGNOSIS — Z20822 Contact with and (suspected) exposure to covid-19: Secondary | ICD-10-CM

## 2019-05-08 LAB — NOVEL CORONAVIRUS, NAA: SARS-CoV-2, NAA: NOT DETECTED

## 2020-02-16 DIAGNOSIS — M25512 Pain in left shoulder: Secondary | ICD-10-CM | POA: Diagnosis not present

## 2020-02-16 DIAGNOSIS — S20212A Contusion of left front wall of thorax, initial encounter: Secondary | ICD-10-CM | POA: Diagnosis not present

## 2020-03-31 DIAGNOSIS — Z131 Encounter for screening for diabetes mellitus: Secondary | ICD-10-CM | POA: Diagnosis not present

## 2020-03-31 DIAGNOSIS — Z0000 Encounter for general adult medical examination without abnormal findings: Secondary | ICD-10-CM | POA: Diagnosis not present

## 2020-03-31 DIAGNOSIS — E785 Hyperlipidemia, unspecified: Secondary | ICD-10-CM | POA: Diagnosis not present

## 2020-06-19 DIAGNOSIS — Z1152 Encounter for screening for COVID-19: Secondary | ICD-10-CM | POA: Diagnosis not present

## 2020-06-19 DIAGNOSIS — Z03818 Encounter for observation for suspected exposure to other biological agents ruled out: Secondary | ICD-10-CM | POA: Diagnosis not present

## 2021-05-08 DIAGNOSIS — Z0000 Encounter for general adult medical examination without abnormal findings: Secondary | ICD-10-CM | POA: Diagnosis not present

## 2021-05-08 DIAGNOSIS — E785 Hyperlipidemia, unspecified: Secondary | ICD-10-CM | POA: Diagnosis not present

## 2022-06-25 DIAGNOSIS — L539 Erythematous condition, unspecified: Secondary | ICD-10-CM | POA: Diagnosis not present

## 2022-06-25 DIAGNOSIS — B9689 Other specified bacterial agents as the cause of diseases classified elsewhere: Secondary | ICD-10-CM | POA: Diagnosis not present

## 2022-06-25 DIAGNOSIS — J208 Acute bronchitis due to other specified organisms: Secondary | ICD-10-CM | POA: Diagnosis not present

## 2022-06-25 DIAGNOSIS — R030 Elevated blood-pressure reading, without diagnosis of hypertension: Secondary | ICD-10-CM | POA: Diagnosis not present

## 2022-08-26 DIAGNOSIS — E785 Hyperlipidemia, unspecified: Secondary | ICD-10-CM | POA: Diagnosis not present

## 2022-08-26 DIAGNOSIS — Z0000 Encounter for general adult medical examination without abnormal findings: Secondary | ICD-10-CM | POA: Diagnosis not present

## 2023-08-30 DIAGNOSIS — Z0000 Encounter for general adult medical examination without abnormal findings: Secondary | ICD-10-CM | POA: Diagnosis not present

## 2023-08-30 DIAGNOSIS — E785 Hyperlipidemia, unspecified: Secondary | ICD-10-CM | POA: Diagnosis not present

## 2023-08-30 DIAGNOSIS — R6882 Decreased libido: Secondary | ICD-10-CM | POA: Diagnosis not present

## 2023-08-30 DIAGNOSIS — I10 Essential (primary) hypertension: Secondary | ICD-10-CM | POA: Diagnosis not present
